# Patient Record
Sex: Male | Born: 1950 | Race: White | Hispanic: No | Marital: Single | Smoking: Never smoker
Health system: Southern US, Community
[De-identification: ages and names within clinical notes are randomized; demographics above are authoritative.]

## PROBLEM LIST (undated history)

## (undated) DIAGNOSIS — K219 Gastro-esophageal reflux disease without esophagitis: Secondary | ICD-10-CM

## (undated) DIAGNOSIS — I340 Nonrheumatic mitral (valve) insufficiency: Secondary | ICD-10-CM

## (undated) DIAGNOSIS — N400 Enlarged prostate without lower urinary tract symptoms: Secondary | ICD-10-CM

## (undated) DIAGNOSIS — M419 Scoliosis, unspecified: Secondary | ICD-10-CM

## (undated) HISTORY — PX: NO PAST SURGERIES: SHX2092

## (undated) HISTORY — DX: Nonrheumatic mitral (valve) insufficiency: I34.0

## (undated) HISTORY — DX: Benign prostatic hyperplasia without lower urinary tract symptoms: N40.0

## (undated) HISTORY — PX: HERNIA REPAIR: SHX51

## (undated) HISTORY — DX: Gastro-esophageal reflux disease without esophagitis: K21.9

---

## 2010-09-29 ENCOUNTER — Ambulatory Visit
Admission: RE | Admit: 2010-09-29 | Discharge: 2010-09-29 | Disposition: A | Discharge status: Home / Self Care | Payer: Medicaid Other | Source: Ambulatory Visit | Attending: Family Medicine | Admitting: Family Medicine

## 2010-09-29 ENCOUNTER — Other Ambulatory Visit: Payer: Self-pay | Admitting: Family Medicine

## 2010-09-29 DIAGNOSIS — M545 Low back pain: Secondary | ICD-10-CM

## 2012-04-11 IMAGING — CR DG LUMBAR SPINE COMPLETE 4+V
5 series · 5 of 5 positions shown · non-contrast
Comparison: None.

CLINICAL DATA: Chronic low back pain radiating into the left hip.
No known injuries.

LUMBAR SPINE - COMPLETE 4+ VIEW 09/29/2010:

[view not recorded (1 of 5)]
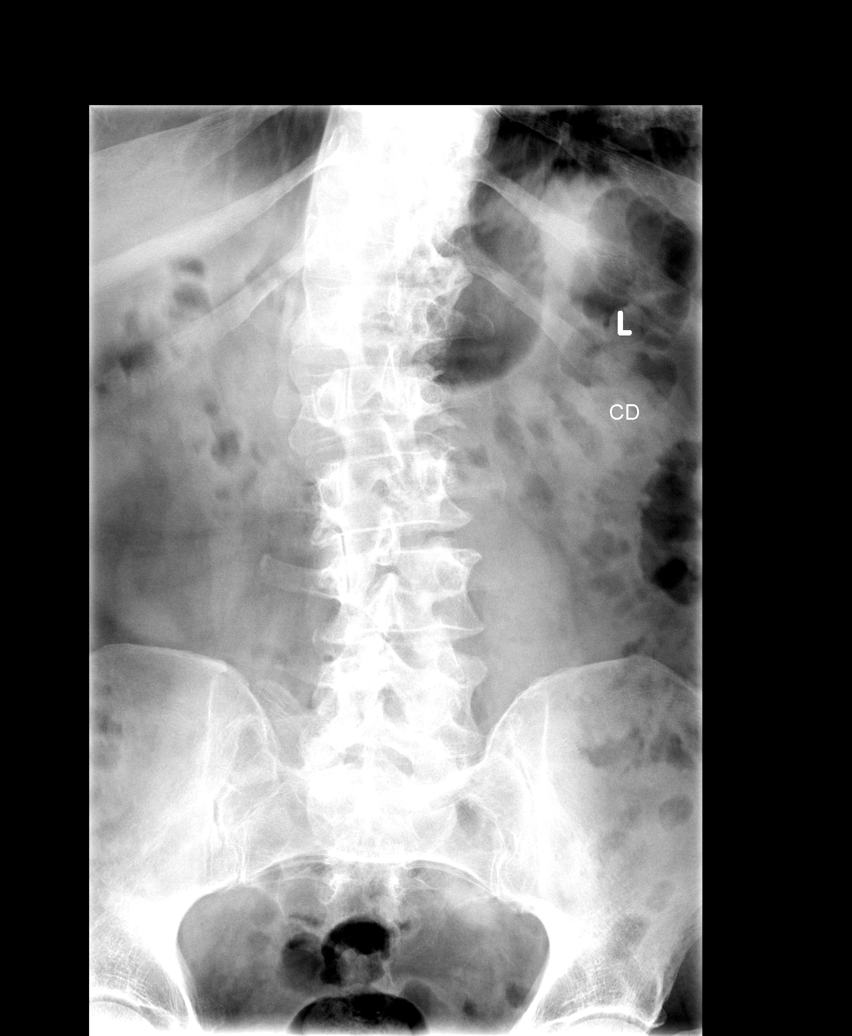

[view not recorded (2 of 5)]
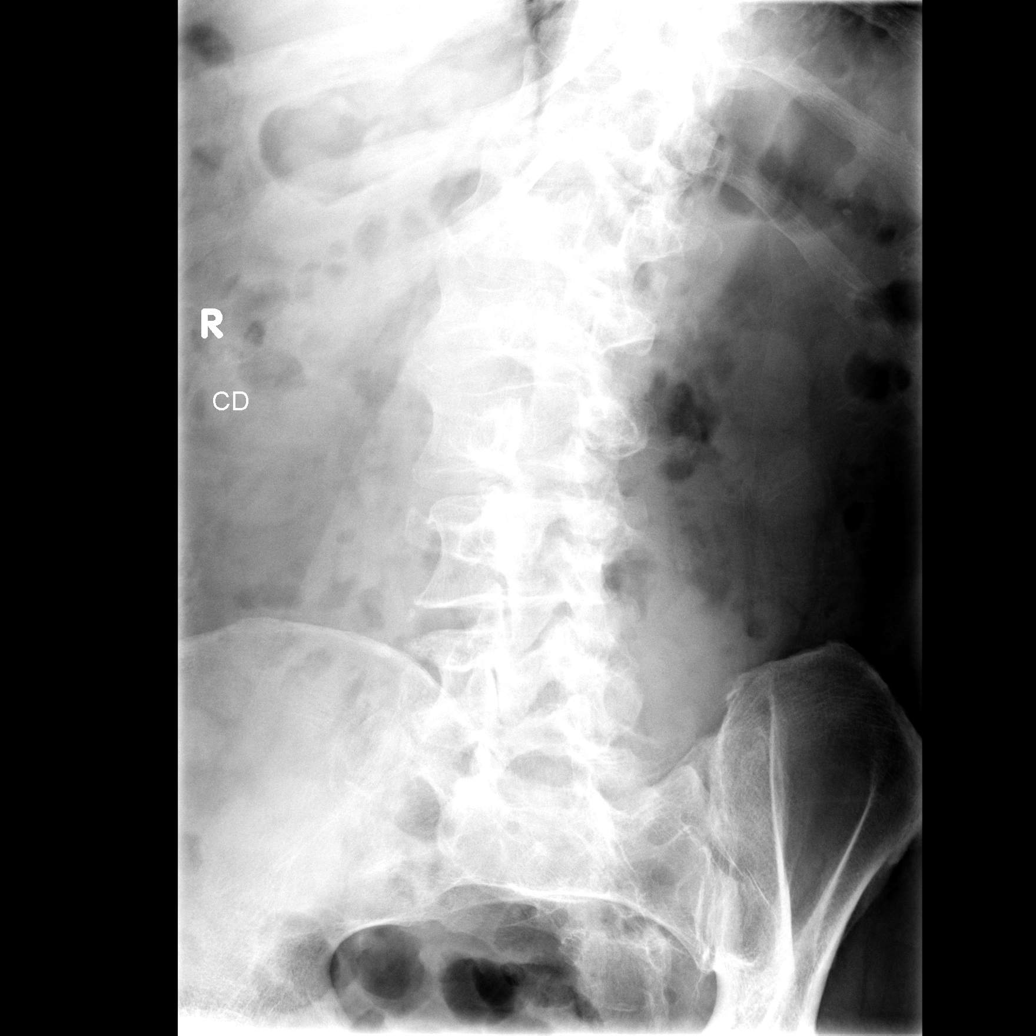

[view not recorded (3 of 5)]
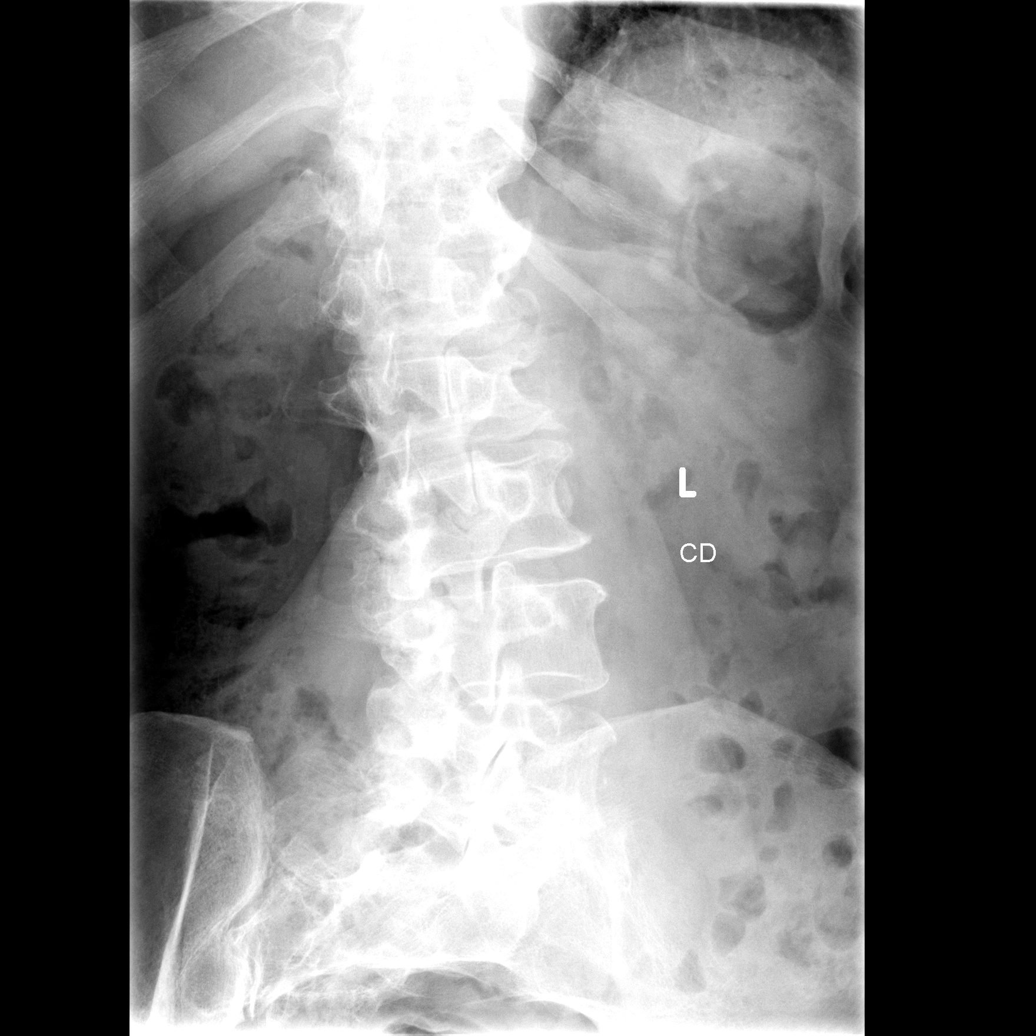

[view not recorded (4 of 5)]
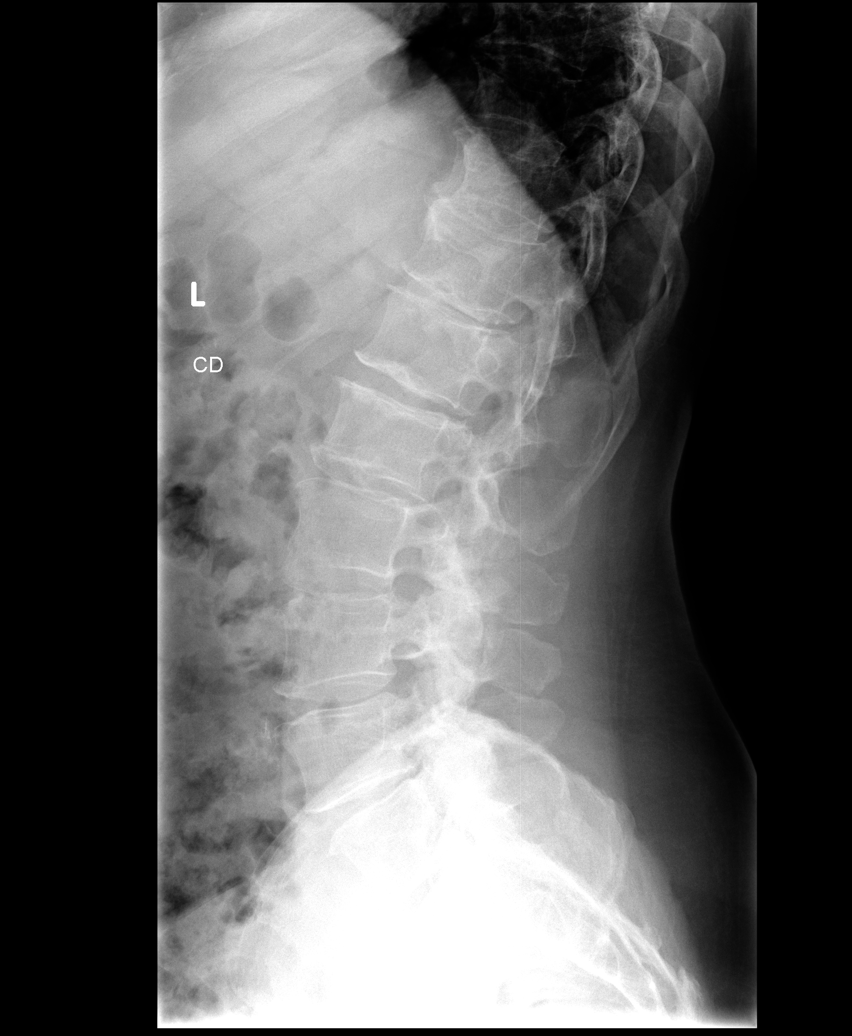

[view not recorded (5 of 5)]
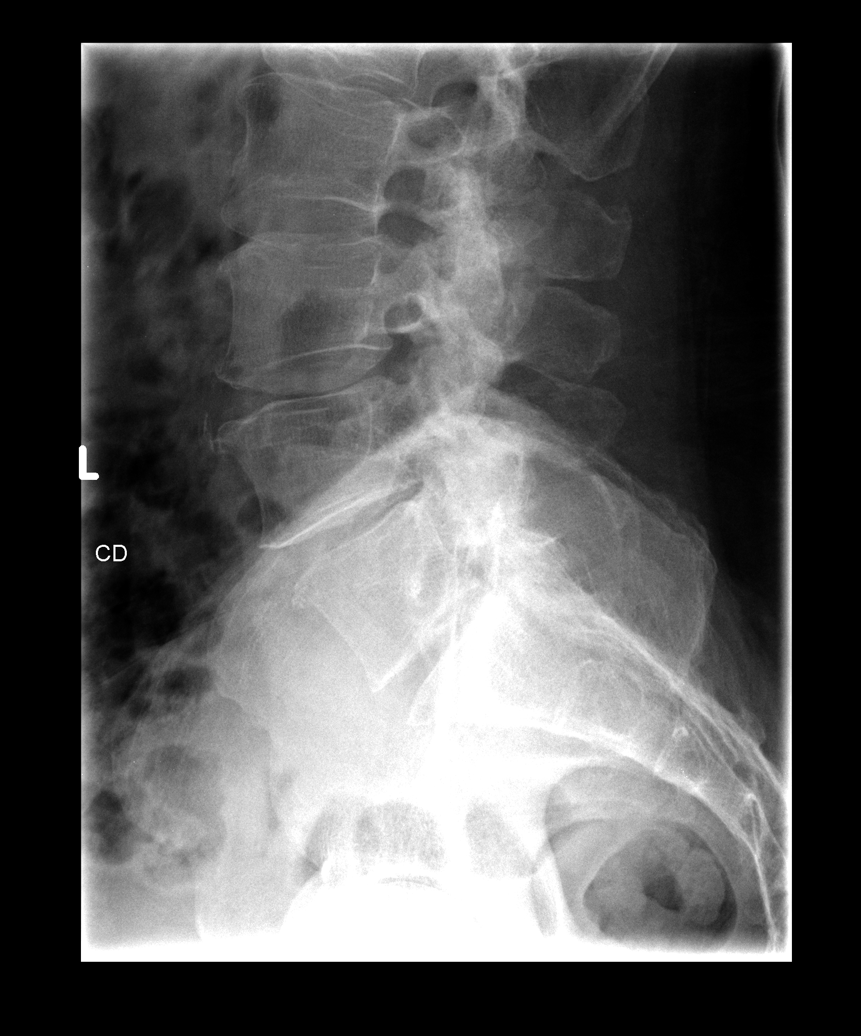

[5 of 5 positions shown; findings below may reference images not displayed]

FINDINGS: For the purposes of this dictation, I am going to assume
six non-rib bearing lumbar vertebra, with L6 representing the last
lumbar segment which is transitional.  I do not have prior chest
imaging with which to count ribs.  If there are imaging studies
elsewhere with a different numbering scheme, please adjust
accordingly.

Anatomic posterior alignment.  No fractures.  Disc space narrowing
and endplate hypertrophic changes at T12-L1, L1-2, L2-3, L5-6, and
L6-S1, worst at L5-L6.  Severe diffuse facet degenerative changes.
No pars defects.  Visualized sacroiliac joints intact with
degenerative changes.
IMPRESSION: No acute osseous abnormalities.  Diffuse degenerative disc disease,
spondylosis, and facet degenerative changes as detailed above.

Please note that there are six non-rib bearing lumbar vertebra
which have been designated L1 through L6 for the purposes of this
report.

## 2013-09-03 ENCOUNTER — Ambulatory Visit (INDEPENDENT_AMBULATORY_CARE_PROVIDER_SITE_OTHER): Payer: 59 | Admitting: Surgery

## 2013-09-19 ENCOUNTER — Encounter (INDEPENDENT_AMBULATORY_CARE_PROVIDER_SITE_OTHER): Payer: Self-pay | Admitting: Surgery

## 2013-09-19 ENCOUNTER — Ambulatory Visit (INDEPENDENT_AMBULATORY_CARE_PROVIDER_SITE_OTHER): Payer: 59 | Admitting: Surgery

## 2013-09-19 VITALS — BP 148/96 | HR 72 | Temp 97.4°F | Resp 14 | Ht 66.0 in | Wt 168.8 lb

## 2013-09-19 DIAGNOSIS — F79 Unspecified intellectual disabilities: Secondary | ICD-10-CM | POA: Insufficient documentation

## 2013-09-19 DIAGNOSIS — K4090 Unilateral inguinal hernia, without obstruction or gangrene, not specified as recurrent: Secondary | ICD-10-CM | POA: Insufficient documentation

## 2013-09-19 NOTE — Progress Notes (Signed)
Chief Complaint:  Right inguinal hernia-possilble bilateral  History of Present Illness:  Nicolas Greene is an 63 y.o. male who is mentally challenged is referred by [name] for bilateral inguinal herniae.  I explained the procedure to his caretaker and to him.    Past Medical History  Diagnosis Date  . GERD (gastroesophageal reflux disease)   . Prostate enlargement   . Mitral incompetence     History reviewed. No pertinent past surgical history.  Current Outpatient Prescriptions  Medication Sig Dispense Refill  . aspirin 325 MG buffered tablet Take 325 mg by mouth daily.      . calcium carbonate (OS-CAL) 1250 MG chewable tablet Chew 1 tablet by mouth daily.      . diazepam (VALIUM) 5 MG tablet       . finasteride (PROSCAR) 5 MG tablet       . guaiFENesin (MUCINEX) 600 MG 12 hr tablet Take by mouth 2 (two) times daily.      Marland Kitchen. HYDROcodone-acetaminophen (NORCO/VICODIN) 5-325 MG per tablet       . ibuprofen (ADVIL,MOTRIN) 200 MG tablet Take 200 mg by mouth every 6 (six) hours as needed.      . meloxicam (MOBIC) 15 MG tablet       . Multiple Vitamin (MULTIVITAMIN) tablet Take 1 tablet by mouth daily.      Marland Kitchen. omeprazole (PRILOSEC) 20 MG capsule       . tamsulosin (FLOMAX) 0.4 MG CAPS capsule        No current facility-administered medications for this visit.   Review of patient's allergies indicates not on file. Family History  Problem Relation Age of Onset  . Cancer Mother     colon   Social History:   reports that he has never smoked. He has never used smokeless tobacco. He reports that he does not drink alcohol or use illicit drugs.   REVIEW OF SYSTEMS - PERTINENT POSITIVES ONLY: Mentally retarded but lives alone  Physical Exam:   Blood pressure 148/96, pulse 72, temperature 97.4 F (36.3 C), temperature source Oral, resp. rate 14, height 5\' 6"  (1.676 m), weight 168 lb 12.8 oz (76.567 kg). Body mass index is 27.26 kg/(m^2).  Gen:  WDWN WM NAD  Neurological: Alert and  oriented to person, place, and time. Motor and sensory function is grossly intact  Head: Normocephalic and atraumatic.  Eyes: Conjunctivae are normal. Pupils are equal, round, and reactive to light. No scleral icterus.  Neck: Normal range of motion. Neck supple. No tracheal deviation or thyromegaly present.  Cardiovascular:  SR without murmurs or gallops.  No carotid bruits Respiratory: Effort normal.  No respiratory distress. No chest wall tenderness. Breath sounds normal.  No wheezes, rales or rhonchi.  Abdomen:  Easily visible right inguinal hernia possible left inguinal hernia GU: Musculoskeletal: Normal range of motion. Extremities are nontender. No cyanosis, edema or clubbing noted Lymphadenopathy: No cervical, preauricular, postauricular or axillary adenopathy is present Skin: Skin is warm and dry. No rash noted. No diaphoresis. No erythema. No pallor. Pscyh: Normal mood and affect. Behavior is normal. Judgment and thought content normal.   LABORATORY RESULTS: No results found for this or any previous visit (from the past 48 hour(s)).  RADIOLOGY RESULTS: No results found.  Problem List: There are no active problems to display for this patient.   Assessment & Plan: Inguinal herniae     Matt B. Daphine DeutscherMartin, MD, [facility]  [facility] [phone] beeper [phone]  09/19/2013 4:30 PM

## 2013-09-19 NOTE — Patient Instructions (Signed)

## 2013-10-01 ENCOUNTER — Encounter (HOSPITAL_COMMUNITY): Payer: Self-pay | Admitting: Pharmacy Technician

## 2013-10-06 ENCOUNTER — Encounter (HOSPITAL_COMMUNITY)
Admission: RE | Admit: 2013-10-06 | Discharge: 2013-10-06 | Disposition: A | Discharge status: Home / Self Care | Payer: Medicare Other | Source: Ambulatory Visit | Attending: Surgery | Admitting: Surgery

## 2013-10-06 ENCOUNTER — Encounter (HOSPITAL_COMMUNITY): Payer: Self-pay

## 2013-10-06 DIAGNOSIS — Z01810 Encounter for preprocedural cardiovascular examination: Secondary | ICD-10-CM | POA: Diagnosis not present

## 2013-10-06 DIAGNOSIS — Z01812 Encounter for preprocedural laboratory examination: Secondary | ICD-10-CM | POA: Insufficient documentation

## 2013-10-06 HISTORY — DX: Scoliosis, unspecified: M41.9

## 2013-10-06 LAB — BASIC METABOLIC PANEL
BUN: 21 mg/dL (ref 6–23)
CHLORIDE: 103 meq/L (ref 96–112)
CO2: 28 meq/L (ref 19–32)
Calcium: 9.2 mg/dL (ref 8.4–10.5)
Creatinine, Ser: 1.06 mg/dL (ref 0.50–1.35)
GFR calc Af Amer: 86 mL/min — ABNORMAL LOW (ref >90)
GFR calc non Af Amer: 74 mL/min — ABNORMAL LOW (ref >90)
Glucose, Bld: 79 mg/dL (ref 70–99)
POTASSIUM: 4.8 meq/L (ref 3.7–5.3)
SODIUM: 141 meq/L (ref 137–147)

## 2013-10-06 LAB — CBC
HEMATOCRIT: 50.8 % (ref 39.0–52.0)
Hemoglobin: 16.9 g/dL (ref 13.0–17.0)
MCH: 31.2 pg (ref 26.0–34.0)
MCHC: 33.3 g/dL (ref 30.0–36.0)
MCV: 93.9 fL (ref 78.0–100.0)
Platelets: 181 10*3/uL (ref 150–400)
RBC: 5.41 MIL/uL (ref 4.22–5.81)
RDW: 12.8 % (ref 11.5–15.5)
WBC: 5.6 10*3/uL (ref 4.0–10.5)

## 2013-10-06 NOTE — Patient Instructions (Addendum)
      Your procedure is scheduled on:  10/10/13  FRIDAY  Report to [facility] at  0940     AM.  Call this number if you have problems the morning of surgery: [phone]     FLEETS ENEMA PER RECTUM Thursday NIGHT   Do not eat food  Or drink :After Midnight. Thursday NIGHT   Take these medicines the morning of surgery with A SIP OF WATER: Carisoprodol, Diazepam, Omeprazole, Hydrocodone   .  Contacts, dentures or partial plates, or metal hairpins  can not be worn to surgery. Your family will be responsible for glasses, dentures, hearing aides while you are in surgery  Leave suitcase in the car. After surgery it may be brought to your room.  For patients admitted to the hospital, checkout time is 11:00 AM day of  discharge.                DO NOT WEAR JEWELRY, LOTIONS, POWDERS, OR PERFUMES.  WOMEN-- DO NOT SHAVE LEGS OR UNDERARMS FOR 48 HOURS BEFORE SHOWERS. MEN MAY SHAVE FACE.  Patients discharged the day of surgery will not be allowed to drive home. IF going home the day of surgery, you must have a driver and someone to stay with you for the first 24 hours  Name and phone number of your driver:      Sister  Olegario MessierKathy Chafin                                                                                     FAILURE TO FOLLOW THESE INSTRUCTIONS MAY RESULT IN  CANCELLATION   OF YOUR SURGERY                                                  Patient Signature _____________________________

## 2013-10-06 NOTE — Progress Notes (Signed)
PST VISIT-  Accompanied by sister in law- Margo Ayenita Guizar- phone [phone].  Per Helen HashimotoAnita, Flem lives next door to her, he lives alone, cares for himself, takes his medications she has fixed in his pill boxes, ambulates well.  Per Dennie FettersAnita, Franklins legal POA was uncle who died last year from cancer.  She states she takes him to all doctor visits,etc.  Janan RidgeFrankilin confirmed his name, bday, was oriented to place, day, answered questions regarding surgery- confirmed by pointing to surgical site area and was agreeable.  Per Synetta Failnita this is appropriate for him.  Patient and Synetta Failnita signed consent.  I instructed Synetta Failnita that soon the family needs to complete new POA papers for future reference. Verbalized understanding.

## 2013-10-10 ENCOUNTER — Ambulatory Visit (HOSPITAL_COMMUNITY): Payer: Medicare Other | Admitting: Registered Nurse

## 2013-10-10 ENCOUNTER — Encounter (HOSPITAL_COMMUNITY): Payer: Self-pay | Admitting: *Deleted

## 2013-10-10 ENCOUNTER — Encounter (HOSPITAL_COMMUNITY): Payer: Medicare Other | Admitting: Registered Nurse

## 2013-10-10 ENCOUNTER — Encounter (HOSPITAL_COMMUNITY)
Admission: RE | Disposition: A | Discharge status: Home / Self Care | Payer: Self-pay | Source: Ambulatory Visit | Attending: Surgery

## 2013-10-10 ENCOUNTER — Ambulatory Visit (HOSPITAL_COMMUNITY)
Admission: RE | Admit: 2013-10-10 | Discharge: 2013-10-10 | Disposition: A | Discharge status: Home / Self Care | Payer: Medicare Other | Source: Ambulatory Visit | Attending: Surgery | Admitting: Surgery

## 2013-10-10 DIAGNOSIS — N400 Enlarged prostate without lower urinary tract symptoms: Secondary | ICD-10-CM | POA: Insufficient documentation

## 2013-10-10 DIAGNOSIS — I059 Rheumatic mitral valve disease, unspecified: Secondary | ICD-10-CM | POA: Insufficient documentation

## 2013-10-10 DIAGNOSIS — Z79899 Other long term (current) drug therapy: Secondary | ICD-10-CM | POA: Insufficient documentation

## 2013-10-10 DIAGNOSIS — K4020 Bilateral inguinal hernia, without obstruction or gangrene, not specified as recurrent: Secondary | ICD-10-CM

## 2013-10-10 DIAGNOSIS — K219 Gastro-esophageal reflux disease without esophagitis: Secondary | ICD-10-CM | POA: Insufficient documentation

## 2013-10-10 DIAGNOSIS — F79 Unspecified intellectual disabilities: Secondary | ICD-10-CM | POA: Insufficient documentation

## 2013-10-10 DIAGNOSIS — K4090 Unilateral inguinal hernia, without obstruction or gangrene, not specified as recurrent: Secondary | ICD-10-CM | POA: Insufficient documentation

## 2013-10-10 DIAGNOSIS — Z7982 Long term (current) use of aspirin: Secondary | ICD-10-CM | POA: Insufficient documentation

## 2013-10-10 HISTORY — PX: INSERTION OF MESH: SHX5868

## 2013-10-10 HISTORY — PX: INGUINAL HERNIA REPAIR: SHX194

## 2013-10-10 SURGERY — REPAIR, HERNIA, INGUINAL, BILATERAL, LAPAROSCOPIC
Anesthesia: General

## 2013-10-10 MED ORDER — HYDROMORPHONE HCL PF 1 MG/ML IJ SOLN
INTRAMUSCULAR | Status: AC
  Filled 2013-10-10: qty 1

## 2013-10-10 MED ORDER — LACTATED RINGERS IR SOLN
Status: DC | PRN
  Administered 2013-10-10: 1000 mL

## 2013-10-10 MED ORDER — HYDROMORPHONE HCL PF 1 MG/ML IJ SOLN
0.2500 mg | INTRAMUSCULAR | Status: DC | PRN
  Administered 2013-10-10 (×4): 0.5 mg via INTRAVENOUS

## 2013-10-10 MED ORDER — GLYCOPYRROLATE 0.2 MG/ML IJ SOLN
INTRAMUSCULAR | Status: DC | PRN
  Administered 2013-10-10: .8 mg via INTRAVENOUS
  Administered 2013-10-10: 0.2 mg via INTRAVENOUS

## 2013-10-10 MED ORDER — GLYCOPYRROLATE 0.2 MG/ML IJ SOLN
INTRAMUSCULAR | Status: AC
  Filled 2013-10-10: qty 4

## 2013-10-10 MED ORDER — ROCURONIUM BROMIDE 100 MG/10ML IV SOLN
INTRAVENOUS | Status: AC
  Filled 2013-10-10: qty 1

## 2013-10-10 MED ORDER — MEPERIDINE HCL 50 MG/ML IJ SOLN
6.2500 mg | INTRAMUSCULAR | Status: DC | PRN
  Administered 2013-10-10: 12.5 mg via INTRAVENOUS

## 2013-10-10 MED ORDER — OXYCODONE HCL 5 MG PO TABS: 5.0000 mg | ORAL_TABLET | Freq: Once | ORAL | Status: DC | PRN

## 2013-10-10 MED ORDER — CEFAZOLIN SODIUM-DEXTROSE 2-3 GM-% IV SOLR
2.0000 g | INTRAVENOUS | Status: AC
  Administered 2013-10-10: 2 g via INTRAVENOUS

## 2013-10-10 MED ORDER — SODIUM CHLORIDE 0.9 % IV SOLN: 250.0000 mL | INTRAVENOUS | Status: DC | PRN

## 2013-10-10 MED ORDER — MIDAZOLAM HCL 2 MG/2ML IJ SOLN
INTRAMUSCULAR | Status: AC
  Filled 2013-10-10: qty 2

## 2013-10-10 MED ORDER — 0.9 % SODIUM CHLORIDE (POUR BTL) OPTIME
TOPICAL | Status: DC | PRN
  Administered 2013-10-10: 1000 mL

## 2013-10-10 MED ORDER — CHLORHEXIDINE GLUCONATE 4 % EX LIQD: 1.0000 "application " | Freq: Once | CUTANEOUS | Status: DC

## 2013-10-10 MED ORDER — SODIUM CHLORIDE 0.9 % IJ SOLN: 3.0000 mL | INTRAMUSCULAR | Status: DC | PRN

## 2013-10-10 MED ORDER — MIDAZOLAM HCL 5 MG/5ML IJ SOLN
INTRAMUSCULAR | Status: DC | PRN
  Administered 2013-10-10: 2 mg via INTRAVENOUS

## 2013-10-10 MED ORDER — ACETAMINOPHEN 325 MG PO TABS: 650.0000 mg | ORAL_TABLET | ORAL | Status: DC | PRN

## 2013-10-10 MED ORDER — LIDOCAINE HCL (CARDIAC) 20 MG/ML IV SOLN
INTRAVENOUS | Status: DC | PRN
  Administered 2013-10-10: 80 mg via INTRAVENOUS

## 2013-10-10 MED ORDER — ROCURONIUM BROMIDE 100 MG/10ML IV SOLN
INTRAVENOUS | Status: DC | PRN
  Administered 2013-10-10: 50 mg via INTRAVENOUS
  Administered 2013-10-10: 10 mg via INTRAVENOUS
  Administered 2013-10-10: 30 mg via INTRAVENOUS

## 2013-10-10 MED ORDER — FENTANYL CITRATE 0.05 MG/ML IJ SOLN
INTRAMUSCULAR | Status: AC
  Filled 2013-10-10: qty 5

## 2013-10-10 MED ORDER — ONDANSETRON HCL 4 MG/2ML IJ SOLN: 4.0000 mg | Freq: Four times a day (QID) | INTRAMUSCULAR | Status: DC | PRN

## 2013-10-10 MED ORDER — NEOSTIGMINE METHYLSULFATE 1 MG/ML IJ SOLN
INTRAMUSCULAR | Status: DC | PRN
  Administered 2013-10-10: 5 mg via INTRAVENOUS

## 2013-10-10 MED ORDER — CEFAZOLIN SODIUM-DEXTROSE 2-3 GM-% IV SOLR
INTRAVENOUS | Status: AC
  Filled 2013-10-10: qty 50

## 2013-10-10 MED ORDER — HEPARIN SODIUM (PORCINE) 5000 UNIT/ML IJ SOLN
5000.0000 [IU] | Freq: Once | INTRAMUSCULAR | Status: AC
  Administered 2013-10-10: 5000 [IU] via SUBCUTANEOUS
  Filled 2013-10-10: qty 1

## 2013-10-10 MED ORDER — OXYCODONE HCL 5 MG/5ML PO SOLN
5.0000 mg | Freq: Once | ORAL | Status: DC | PRN
  Filled 2013-10-10: qty 5

## 2013-10-10 MED ORDER — OXYCODONE HCL 5 MG PO TABS
5.0000 mg | ORAL_TABLET | ORAL | Status: DC | PRN
  Administered 2013-10-10: 5 mg via ORAL
  Filled 2013-10-10: qty 1

## 2013-10-10 MED ORDER — PROPOFOL 10 MG/ML IV BOLUS
INTRAVENOUS | Status: DC | PRN
Start: 2013-10-10 — End: 2013-10-10
  Administered 2013-10-10: 200 mg via INTRAVENOUS

## 2013-10-10 MED ORDER — PROPOFOL 10 MG/ML IV BOLUS
INTRAVENOUS | Status: AC
  Filled 2013-10-10: qty 20

## 2013-10-10 MED ORDER — EPHEDRINE SULFATE 50 MG/ML IJ SOLN
INTRAMUSCULAR | Status: DC | PRN
Start: 2013-10-10 — End: 2013-10-10
  Administered 2013-10-10: 5 mg via INTRAVENOUS
  Administered 2013-10-10: 10 mg via INTRAVENOUS
  Administered 2013-10-10: 5 mg via INTRAVENOUS

## 2013-10-10 MED ORDER — MEPERIDINE HCL 50 MG/ML IJ SOLN
INTRAMUSCULAR | Status: AC
  Filled 2013-10-10: qty 1

## 2013-10-10 MED ORDER — ACETAMINOPHEN 650 MG RE SUPP
650.0000 mg | RECTAL | Status: DC | PRN
  Filled 2013-10-10: qty 1

## 2013-10-10 MED ORDER — SODIUM CHLORIDE 0.9 % IJ SOLN
INTRAMUSCULAR | Status: AC
  Filled 2013-10-10: qty 10

## 2013-10-10 MED ORDER — BUPIVACAINE LIPOSOME 1.3 % IJ SUSP
20.0000 mL | Freq: Once | INTRAMUSCULAR | Status: AC
  Administered 2013-10-10: 20 mL
  Filled 2013-10-10: qty 20

## 2013-10-10 MED ORDER — LACTATED RINGERS IV SOLN
INTRAVENOUS | Status: DC | PRN
  Administered 2013-10-10: 11:00:00 via INTRAVENOUS

## 2013-10-10 MED ORDER — FENTANYL CITRATE 0.05 MG/ML IJ SOLN
INTRAMUSCULAR | Status: DC | PRN
  Administered 2013-10-10: 100 ug via INTRAVENOUS
  Administered 2013-10-10: 50 ug via INTRAVENOUS
  Administered 2013-10-10: 25 ug via INTRAVENOUS

## 2013-10-10 MED ORDER — HYDROCODONE-ACETAMINOPHEN 5-325 MG PO TABS: 1.0000 | ORAL_TABLET | Freq: Four times a day (QID) | ORAL | Status: AC | PRN

## 2013-10-10 MED ORDER — PROMETHAZINE HCL 25 MG/ML IJ SOLN: 6.2500 mg | INTRAMUSCULAR | Status: DC | PRN

## 2013-10-10 MED ORDER — LIDOCAINE HCL (CARDIAC) 20 MG/ML IV SOLN
INTRAVENOUS | Status: AC
  Filled 2013-10-10: qty 5

## 2013-10-10 MED ORDER — SODIUM CHLORIDE 0.9 % IJ SOLN: 3.0000 mL | Freq: Two times a day (BID) | INTRAMUSCULAR | Status: DC

## 2013-10-10 MED ORDER — EPHEDRINE SULFATE 50 MG/ML IJ SOLN
INTRAMUSCULAR | Status: AC
  Filled 2013-10-10: qty 1

## 2013-10-10 SURGICAL SUPPLY — 34 items
BENZOIN TINCTURE PRP APPL 2/3 (GAUZE/BANDAGES/DRESSINGS) IMPLANT
CABLE HIGH FREQUENCY MONO STRZ (ELECTRODE) ×4 IMPLANT
CLOSURE WOUND 1/2 X4 (GAUZE/BANDAGES/DRESSINGS)
COVER SURGICAL LIGHT HANDLE (MISCELLANEOUS) IMPLANT
DECANTER SPIKE VIAL GLASS SM (MISCELLANEOUS) ×4 IMPLANT
DERMABOND ADVANCED (GAUZE/BANDAGES/DRESSINGS) ×2
DERMABOND ADVANCED .7 DNX12 (GAUZE/BANDAGES/DRESSINGS) ×2 IMPLANT
DISSECT BALLN SPACEMKR + OVL (BALLOONS) ×4
DISSECTOR BALLN SPACEMKR + OVL (BALLOONS) ×2 IMPLANT
DISSECTOR BLUNT TIP ENDO 5MM (MISCELLANEOUS) ×4 IMPLANT
DRAPE LAPAROSCOPIC ABDOMINAL (DRAPES) ×4 IMPLANT
ELECT REM PT RETURN 9FT ADLT (ELECTROSURGICAL) ×4
ELECTRODE REM PT RTRN 9FT ADLT (ELECTROSURGICAL) ×2 IMPLANT
ENDOLOOP SUT PDS II  0 18 (SUTURE) ×4
ENDOLOOP SUT PDS II 0 18 (SUTURE) ×4 IMPLANT
GLOVE BIOGEL M 8.0 STRL (GLOVE) ×4 IMPLANT
GOWN STRL REUS W/TWL XL LVL3 (GOWN DISPOSABLE) ×12 IMPLANT
KIT BASIN OR (CUSTOM PROCEDURE TRAY) ×4 IMPLANT
MARKER SKIN DUAL TIP RULER LAB (MISCELLANEOUS) ×4 IMPLANT
MESH 3DMAX 4X6 LT LRG (Mesh General) ×4 IMPLANT
MESH 3DMAX 4X6 RT LRG (Mesh General) ×4 IMPLANT
PENCIL BUTTON HOLSTER BLD 10FT (ELECTRODE) ×4 IMPLANT
SCISSORS LAP 5X35 DISP (ENDOMECHANICALS) ×4 IMPLANT
SET IRRIG TUBING LAPAROSCOPIC (IRRIGATION / IRRIGATOR) IMPLANT
SLEEVE XCEL OPT CAN 5 100 (ENDOMECHANICALS) ×4 IMPLANT
SOLUTION ANTI FOG 6CC (MISCELLANEOUS) ×4 IMPLANT
STRIP CLOSURE SKIN 1/2X4 (GAUZE/BANDAGES/DRESSINGS) IMPLANT
SUT VIC AB 4-0 SH 18 (SUTURE) ×4 IMPLANT
TACKER 5MM HERNIA 3.5CML NAB (ENDOMECHANICALS) ×4 IMPLANT
TOWEL OR NON WOVEN STRL DISP B (DISPOSABLE) ×4 IMPLANT
TRAY FOLEY CATH 14FRSI W/METER (CATHETERS) ×4 IMPLANT
TRAY LAP CHOLE (CUSTOM PROCEDURE TRAY) ×4 IMPLANT
TROCAR BLADELESS OPT 5 75 (ENDOMECHANICALS) ×4 IMPLANT
TUBING INSUFFLATION 10FT LAP (TUBING) ×4 IMPLANT

## 2013-10-10 NOTE — Anesthesia Postprocedure Evaluation (Signed)
Anesthesia Post Note  Patient: Nicolas Greene  Procedure(s) Performed: Procedure(s) (LRB): LAPAROSCOPIC BILATERAL INGUINAL HERNIA REPAIR (Bilateral) INSERTION OF MESH (N/A)  Anesthesia type: General  Patient location: PACU  Post pain: Pain level controlled  Post assessment: Post-op Vital signs reviewed  Last Vitals: BP 144/87  Pulse 96  Temp(Src) 36.8 C (Oral)  Resp 20  SpO2 94%  Post vital signs: Reviewed  Level of consciousness: sedated  Complications: No apparent anesthesia complications\

## 2013-10-10 NOTE — Transfer of Care (Signed)
Immediate Anesthesia Transfer of Care Note  Patient: Nicolas Greene  Procedure(s) Performed: Procedure(s): LAPAROSCOPIC BILATERAL INGUINAL HERNIA REPAIR (Bilateral) INSERTION OF MESH (N/A)  Patient Location: PACU  Anesthesia Type:General  Level of Consciousness: awake, alert , oriented and patient cooperative  Airway & Oxygen Therapy: Patient Spontanous Breathing and Patient connected to face mask oxygen  Post-op Assessment: Report given to PACU RN, Post -op Vital signs reviewed and stable and Patient moving all extremities  Post vital signs: Reviewed and stable  Complications: No apparent anesthesia complications 

## 2013-10-10 NOTE — Preoperative (Signed)
Beta Blockers   Reason not to administer Beta Blockers:Not Applicable 

## 2013-10-10 NOTE — Discharge Instructions (Signed)
Hernia Repair °Care After °These instructions give you information on caring for yourself after your procedure. Your doctor may also give you more specific instructions. Call your doctor if you have any problems or questions after your procedure. °HOME CARE  °· You may have changes in your poops (bowel movements). °· You may have loose or watery poop (diarrhea). °· You may be not able to poop. °· Your bowels will slowly get back to normal. °· Do not eat any food that makes you sick to your stomach (nauseous). Eat small meals 4 to 6 times a day instead of 3 large ones. °· Do not drink pop. It will give you gas. °· Do not drink alcohol. °· Do not lift anything heavier than 10 pounds. This is about the weight of a gallon of milk. °· Do not do anything that makes you very tired for at least 6 weeks. °· Do not get your wound wet for 2 days. °· You may take a sponge bath during this time. °· After 2 days you may take a shower. Gently pat your surgical cut (incision) dry with a towel. Do not rub it. °· For men: You may have been given an athletic supporter (scrotal support) before you left the hospital. It holds your scrotum and testicles closer to your body so there is no strain on your wound. Wear the supporter until your doctor tells you that you do not need it anymore. °GET HELP RIGHT AWAY IF: °· You have watery poop, or cannot poop for more than 3 days. °· You feel sick to your stomach or throw up (vomit) more than 2 or 3 times. °· You have temperature by mouth above 102° F (38.9° C). °· You see redness or puffiness (swelling) around your wound. °· You see yellowish white fluid (pus) coming from your wound. °· You see a bulge or bump in your lower belly (abdomen) or near your groin. °· You develop a rash, trouble breathing, or any other symptoms from medicines taken. °MAKE SURE YOU: °· Understand these instructions. °· Will watch your condition. °· Will get help right away if your are not doing well or get  worse. °Document Released: 07/20/2008 Document Revised: 10/30/2011 Document Reviewed: 07/20/2008 °ExitCare® Patient Information ©2014 [employer]. ° °

## 2013-10-10 NOTE — H&P (Signed)
Chief Complaint: Right inguinal hernia-possilble bilateral  History of Present Illness: Nicolas Greene is an 63 y.o. male who is mentally challenged is referred by Lesly Rubenstein for bilateral inguinal herniae. I explained the procedure to his caretaker and to him.  Past Medical History   Diagnosis  Date   .  GERD (gastroesophageal reflux disease)    .  Prostate enlargement    .  Mitral incompetence    History reviewed. No pertinent past surgical history.  Current Outpatient Prescriptions   Medication  Sig  Dispense  Refill   .  aspirin 325 MG buffered tablet  Take 325 mg by mouth daily.     .  calcium carbonate (OS-CAL) 1250 MG chewable tablet  Chew 1 tablet by mouth daily.     .  diazepam (VALIUM) 5 MG tablet      .  finasteride (PROSCAR) 5 MG tablet      .  guaiFENesin (MUCINEX) 600 MG 12 hr tablet  Take by mouth 2 (two) times daily.     Marland Kitchen  HYDROcodone-acetaminophen (NORCO/VICODIN) 5-325 MG per tablet      .  ibuprofen (ADVIL,MOTRIN) 200 MG tablet  Take 200 mg by mouth every 6 (six) hours as needed.     .  meloxicam (MOBIC) 15 MG tablet      .  Multiple Vitamin (MULTIVITAMIN) tablet  Take 1 tablet by mouth daily.     Marland Kitchen  omeprazole (PRILOSEC) 20 MG capsule      .  tamsulosin (FLOMAX) 0.4 MG CAPS capsule       No current facility-administered medications for this visit.   Review of patient's allergies indicates not on file.  Family History   Problem  Relation  Age of Onset   .  Cancer  Mother      colon   Social History: reports that he has never smoked. He has never used smokeless tobacco. He reports that he does not drink alcohol or use illicit drugs.  REVIEW OF SYSTEMS - PERTINENT POSITIVES ONLY:  Mentally retarded but lives alone  Physical Exam:  Blood pressure 148/96, pulse 72, temperature 97.4 F (36.3 C), temperature source Oral, resp. rate 14, height 5\' 6"  (1.676 m), weight 168 lb 12.8 oz (76.567 kg).  Body mass index is 27.26 kg/(m^2).  Gen: WDWN WM NAD  Neurological:  Alert and oriented to person, place, and time. Motor and sensory function is grossly intact  Head: Normocephalic and atraumatic.  Eyes: Conjunctivae are normal. Pupils are equal, round, and reactive to light. No scleral icterus.  Neck: Normal range of motion. Neck supple. No tracheal deviation or thyromegaly present.  Cardiovascular: SR without murmurs or gallops. No carotid bruits  Respiratory: Effort normal. No respiratory distress. No chest wall tenderness. Breath sounds normal. No wheezes, rales or rhonchi.  Abdomen: Easily visible right inguinal hernia possible left inguinal hernia  GU:  Musculoskeletal: Normal range of motion. Extremities are nontender. No cyanosis, edema or clubbing noted Lymphadenopathy: No cervical, preauricular, postauricular or axillary adenopathy is present Skin: Skin is warm and dry. No rash noted. No diaphoresis. No erythema. No pallor. Pscyh: Normal mood and affect. Behavior is normal. Judgment and thought content normal.  LABORATORY RESULTS:  No results found for this or any previous visit (from the past 48 hour(s)).  RADIOLOGY RESULTS:  No results found.  Problem List:  There are no active problems to display for this patient.  Assessment & Plan:  Inguinal herniae  Matt B. Daphine Deutscher, MD, FACS  [facility]  [phone] beeper  [phone]

## 2013-10-10 NOTE — Op Note (Signed)
Surgeon: Wenda LowMatt Bianey Tesoro, MD, FACS  Asst:  none  Anes:  general  Procedure: Laparoscopic repair of bilateral inguinal herniae (indirect) with ligation of the sacs and placement of large 3 D Max mesh  Diagnosis: Bilateral inguinal herniae R>L  Complications: none  EBL:   minimal cc  Description of Procedure:  The patient was taken to room 1 and given general anesthesia. The abdomen was prepped with PCMX and draped sterilely. Access to the abdomen was achieved to allow the umbilicus to the right placing a balloon dissector and getting a good preperitoneal dissection. Camera was inserted and 25 mm placed laterally. Blunt dissection was used to dissect out the right and left inguinal structures and found bilateral indirect sacs which I dissected off of the cord structures and then ligated them with a Endoloop. The hernia sacs were then pulled medially and I then installed panels of 3-D max mesh large first placing left side after cutting a little bit laterally so to fit around the cord structures and then securing with the tacker medially superiorly and laterally where I could palpate the tacker. Similarly on the right side a placed the right 3-D max patch abutting the one on the left in the midline and again secured with multiple tacks. Good good lie was noted. The area was then deflated. Bleeding had been controlled and everything appeared to be in order. The umbilical defect was closed 2 simple sutures 0 Vicryl. The wounds were injected with Exparel and closed 4-0 Vicryl with Dermabond. Patient our the procedure well taken recovery in satisfactory condition.  Matt B. Daphine DeutscherMartin, MD, [facility] [place] [phone]

## 2013-10-10 NOTE — Anesthesia Preprocedure Evaluation (Addendum)
Anesthesia Evaluation  Patient identified by MRN, date of birth, ID band Patient awake    Reviewed: Allergy & Precautions, H&P , NPO status , Patient's Chart, lab work & pertinent test results  History of Anesthesia Complications (+) MALIGNANT HYPERTHERMIA  Airway Mallampati: II TM Distance: >3 FB Neck ROM: Full    Dental  (+) Dental Advisory Given   Pulmonary neg pulmonary ROS,  breath sounds clear to auscultation        Cardiovascular negative cardio ROS  + Valvular Problems/Murmurs MVP Rhythm:Regular Rate:Normal     Neuro/Psych PSYCHIATRIC DISORDERS MRnegative neurological ROS     GI/Hepatic Neg liver ROS, GERD-  Medicated,  Endo/Other  negative endocrine ROS  Renal/GU negative Renal ROS     Musculoskeletal negative musculoskeletal ROS (+)   Abdominal   Peds  Hematology negative hematology ROS (+)   Anesthesia Other Findings   Reproductive/Obstetrics negative OB ROS                          Anesthesia Physical Anesthesia Plan  ASA: II  Anesthesia Plan: General   Post-op Pain Management:    Induction: Intravenous  Airway Management Planned: Oral ETT  Additional Equipment:   Intra-op Plan:   Post-operative Plan: Extubation in OR  Informed Consent: I have reviewed the patients History and Physical, chart, labs and discussed the procedure including the risks, benefits and alternatives for the proposed anesthesia with the patient or authorized representative who has indicated his/her understanding and acceptance.   Dental advisory given  Plan Discussed with: CRNA  Anesthesia Plan Comments:         Anesthesia Quick Evaluation

## 2013-10-10 NOTE — Interval H&P Note (Signed)
History and Physical Interval Note:  10/10/2013 11:06 AM  Nicolas Greene  has presented today for surgery, with the diagnosis of bilateral inguinal hernia  The various methods of treatment have been discussed with the patient and family. After consideration of risks, benefits and other options for treatment, the patient has consented to  Procedure(s): LAPAROSCOPIC BILATERAL INGUINAL HERNIA REPAIR (Bilateral) INSERTION OF MESH (N/A) as a surgical intervention .  The patient's history has been reviewed, patient examined, no change in status, stable for surgery.  I have reviewed the patient's chart and labs.  Questions were answered to the patient's satisfaction.     Sharlon Pfohl B

## 2013-10-10 NOTE — Transfer of Care (Signed)
Immediate Anesthesia Transfer of Care Note  Patient: Nicolas Greene  Procedure(s) Performed: Procedure(s): LAPAROSCOPIC BILATERAL INGUINAL HERNIA REPAIR (Bilateral) INSERTION OF MESH (N/A)  Patient Location: PACU  Anesthesia Type:General  Level of Consciousness: awake, alert , oriented and patient cooperative  Airway & Oxygen Therapy: Patient Spontanous Breathing and Patient connected to face mask oxygen  Post-op Assessment: Report given to PACU RN, Post -op Vital signs reviewed and stable and Patient moving all extremities  Post vital signs: Reviewed and stable  Complications: No apparent anesthesia complications

## 2013-10-13 ENCOUNTER — Encounter (HOSPITAL_COMMUNITY): Payer: Self-pay | Admitting: Surgery

## 2013-10-27 ENCOUNTER — Encounter (INDEPENDENT_AMBULATORY_CARE_PROVIDER_SITE_OTHER): Payer: 59 | Admitting: Surgery

## 2013-11-12 ENCOUNTER — Encounter (INDEPENDENT_AMBULATORY_CARE_PROVIDER_SITE_OTHER): Payer: Self-pay | Admitting: Surgery

## 2013-11-12 ENCOUNTER — Ambulatory Visit (INDEPENDENT_AMBULATORY_CARE_PROVIDER_SITE_OTHER): Payer: 59 | Admitting: Surgery

## 2013-11-12 VITALS — BP 126/74 | HR 78 | Temp 98.6°F | Resp 18 | Ht 65.0 in | Wt 161.2 lb

## 2013-11-12 DIAGNOSIS — K4090 Unilateral inguinal hernia, without obstruction or gangrene, not specified as recurrent: Secondary | ICD-10-CM

## 2013-11-12 NOTE — Progress Notes (Signed)
Nicolas Greene 63 y.o.  Body mass index is 26.83 kg/(m^2).  Patient Active Problem List   Diagnosis Date Noted  . Right inguinal hernia 09/19/2013  . Mental retardation 09/19/2013    No Known Allergies  Past Surgical History  Procedure Laterality Date  . No past surgeries    . Inguinal hernia repair Bilateral 10/10/2013    Procedure: LAPAROSCOPIC BILATERAL INGUINAL HERNIA REPAIR;  Surgeon: Valarie MerinoMatthew B Kaspar Albornoz, MD;  Location: [facility];  Service: General;  Laterality: Bilateral;  . Insertion of mesh N/A 10/10/2013    Procedure: INSERTION OF MESH;  Surgeon: Valarie MerinoMatthew B Denario Bagot, MD;  Location: [facility];  Service: General;  Laterality: N/A;  . Hernia repair     [name],JAMES, MD No diagnosis found.  First postop visit for Nicolas Greene. He comes back with his sister-in-law who is his primary caregiver. His incisions have healed nicely and he has no evidence of recurrence. I told him he can resume his preoperative activities and we will see him when necessary. Matt B. Daphine DeutscherMartin, MD, [facility]  [facility] [phone] beeper [phone]  11/12/2013 3:04 PM
